# Patient Record
Sex: Male | Born: 1960 | Race: White | Hispanic: No | Marital: Married | Smoking: Former smoker
Health system: Southern US, Community
[De-identification: ages and names within clinical notes are randomized; demographics above are authoritative.]

## PROBLEM LIST (undated history)

## (undated) ENCOUNTER — Emergency Department (HOSPITAL_BASED_OUTPATIENT_CLINIC_OR_DEPARTMENT_OTHER): Admission: EM | Payer: Managed Care, Other (non HMO) | Source: Home / Self Care

## (undated) DIAGNOSIS — K760 Fatty (change of) liver, not elsewhere classified: Secondary | ICD-10-CM

## (undated) DIAGNOSIS — N400 Enlarged prostate without lower urinary tract symptoms: Secondary | ICD-10-CM

## (undated) DIAGNOSIS — M069 Rheumatoid arthritis, unspecified: Principal | ICD-10-CM

## (undated) HISTORY — DX: Benign prostatic hyperplasia without lower urinary tract symptoms: N40.0

## (undated) HISTORY — PX: OTHER SURGICAL HISTORY: SHX169

## (undated) HISTORY — DX: Rheumatoid arthritis, unspecified: M06.9

## (undated) HISTORY — PX: BACK SURGERY: SHX140

## (undated) HISTORY — DX: Fatty (change of) liver, not elsewhere classified: K76.0

## (undated) HISTORY — PX: COLONOSCOPY: SHX174

---

## 2005-12-15 HISTORY — PX: NOSE SURGERY: SHX723

## 2005-12-15 HISTORY — PX: OTHER SURGICAL HISTORY: SHX169

## 2011-11-13 LAB — HEPATIC FUNCTION PANEL
ALK PHOS: 53 U/L (ref 25–125)
ALT: 83 U/L — AB (ref 10–40)
AST: 28 U/L (ref 14–40)

## 2013-10-05 ENCOUNTER — Encounter: Payer: Self-pay | Admitting: Family Medicine

## 2013-10-05 ENCOUNTER — Ambulatory Visit (INDEPENDENT_AMBULATORY_CARE_PROVIDER_SITE_OTHER): Payer: Managed Care, Other (non HMO) | Admitting: Family Medicine

## 2013-10-05 VITALS — BP 127/80 | HR 75 | Ht 73.0 in | Wt 285.0 lb

## 2013-10-05 DIAGNOSIS — R229 Localized swelling, mass and lump, unspecified: Secondary | ICD-10-CM | POA: Insufficient documentation

## 2013-10-05 DIAGNOSIS — N400 Enlarged prostate without lower urinary tract symptoms: Secondary | ICD-10-CM | POA: Insufficient documentation

## 2013-10-05 DIAGNOSIS — M069 Rheumatoid arthritis, unspecified: Secondary | ICD-10-CM

## 2013-10-05 DIAGNOSIS — M549 Dorsalgia, unspecified: Secondary | ICD-10-CM | POA: Insufficient documentation

## 2013-10-05 HISTORY — DX: Rheumatoid arthritis, unspecified: M06.9

## 2013-10-05 MED ORDER — MELOXICAM 15 MG PO TABS: 15.0000 mg | ORAL_TABLET | Freq: Every day | ORAL | Status: DC

## 2013-10-05 NOTE — Progress Notes (Deleted)
  Subjective:    Patient ID: Walter Douglas, male    DOB: 08-Mar-1961, 52 y.o.   MRN: [id number]  HPI    Review of Systems     Objective:   Physical Exam        Assessment & Plan:

## 2013-10-05 NOTE — Progress Notes (Signed)
CC: Walter Douglas is a 52 y.o. male is here for Establish Care and Back Pain   Subjective: HPI:  Very pleasant 52 year old with a self-reported diagnosis of rheumatoid arthritis here to establish care  Patient complains of right low back pain that has been present since Saturday of this last week without any inciting event. It is described as a soreness it is nonradiating mild to moderate severity. Present mostly when going from a seated to standing position greatly improves the more he walks. Pain is worse with pressing on the low back. It does not radiate. He has had similar pain in the remote past but never to this degree. Has tried 1 or 2 doses of an anti-inflammatory without much improvement. He denies midline back pain, saddle paresthesia, nor bowel or bladder incontinence. Symptoms have been present on a daily basis it's not uncommon for pain to go completely away if he is mobile. Denies weakness in either leg.  He admits to over 30 pounds of weight loss in the past months however this is due to drastic increase in physical activity and dietary changes  Review of Systems - General ROS: negative for - chills, fever, night sweats, or weight loss Ophthalmic ROS: negative for - decreased vision Psychological ROS: negative for - anxiety or depression ENT ROS: negative for - hearing change, nasal congestion, tinnitus or allergies Hematological and Lymphatic ROS: negative for - bleeding problems, bruising or swollen lymph nodes Breast ROS: negative Respiratory ROS: no cough, shortness of breath, or wheezing Cardiovascular ROS: no chest pain or dyspnea on exertion Gastrointestinal ROS: no abdominal pain, change in bowel habits, or black or bloody stools Genito-Urinary ROS: negative for - genital discharge, genital ulcers, incontinence or abnormal bleeding from genitals Musculoskeletal ROS: negative for - joint pain or muscle pain other than that above Neurological ROS: negative for - headaches  or memory loss Dermatological ROS: negative for lumps, mole changes, rash and skin lesion changes  Past Medical History  Diagnosis Date  . Rheumatoid arthritis 10/05/2013    Pt self reported      History reviewed. No pertinent family history.   History  Substance Use Topics  . Smoking status: Former Games developer  . Smokeless tobacco: Not on file     Comment: quit 20 years ago  . Alcohol Use: No     Objective: Filed Vitals:   10/05/13 1522  BP: 127/80  Pulse: 75    General: Alert and Oriented, No Acute Distress HEENT: Pupils equal, round, reactive to light. Conjunctivae clear.  Moist mucous membranes pharynx unremarkable Lungs: Clear to auscultation bilaterally, no wheezing/ronchi/rales.  Comfortable work of breathing. Good air movement. Cardiac: Regular rate and rhythm. Normal S1/S2.  No murmurs, rubs, nor gallops.   Abdomen: Obese soft nontender Extremities: No peripheral edema.  Strong peripheral pulses. Full range of motion strength in both lower extremities L4 and S1 DTRs two over four bilaterally. Bilaterally straight leg raises negative, negative log roll, negative FABER/FADIR Back: No midline spinous process tenderness in the lumbar region, stork test negative bilaterally, pain is reproduced with palpation of a approximately 2-3 cm nodule just above the right SI joint which is mobile Mental Status: No depression, anxiety, nor agitation. Skin: Warm and dry.  Assessment & Plan: Walter Douglas was seen today for establish care and back pain.  Diagnoses and associated orders for this visit:  Rheumatoid arthritis  Back pain - meloxicam (MOBIC) 15 MG tablet; Take 1 tablet (15 mg total) by mouth daily.  Subcutaneous nodule  Discussed with patient my suspicion of inflamed lipoma or sebaceous cyst causing his pain, I encouraged him to take a week of daily meloxicam if not improving in one week I like to have a CT scan with contrast to screen for any oncologic process Rheumatoid  arthritis: Awaiting outside records for clarification of this, suspicious of this diagnosis given that he is never been on arthritis medication beyond diclofenac  Return if symptoms worsen or fail to improve.

## 2013-10-20 ENCOUNTER — Other Ambulatory Visit: Payer: Self-pay

## 2013-12-19 ENCOUNTER — Encounter: Payer: Self-pay | Admitting: Family Medicine

## 2013-12-20 ENCOUNTER — Encounter: Payer: Self-pay | Admitting: Family Medicine

## 2013-12-20 ENCOUNTER — Ambulatory Visit (INDEPENDENT_AMBULATORY_CARE_PROVIDER_SITE_OTHER): Payer: Managed Care, Other (non HMO) | Admitting: Family Medicine

## 2013-12-20 VITALS — BP 133/87 | HR 87 | Wt 306.0 lb

## 2013-12-20 DIAGNOSIS — R454 Irritability and anger: Secondary | ICD-10-CM

## 2013-12-20 DIAGNOSIS — R222 Localized swelling, mass and lump, trunk: Secondary | ICD-10-CM | POA: Insufficient documentation

## 2013-12-20 DIAGNOSIS — M549 Dorsalgia, unspecified: Secondary | ICD-10-CM

## 2013-12-20 DIAGNOSIS — R229 Localized swelling, mass and lump, unspecified: Secondary | ICD-10-CM

## 2013-12-20 MED ORDER — CYCLOBENZAPRINE HCL 10 MG PO TABS: ORAL_TABLET | ORAL | Status: DC

## 2013-12-20 MED ORDER — ESCITALOPRAM OXALATE 10 MG PO TABS: 10.0000 mg | ORAL_TABLET | Freq: Every day | ORAL | Status: DC

## 2013-12-20 NOTE — Progress Notes (Signed)
CC: Walter Douglas is a 53 y.o. male is here for depression?   Subjective: HPI:   followup back pain: Over the past 3 months patient reports that he has had waxing and waning low back pain similar to that which she presented for initially. Taking meloxicam most days of the week mild improvement. Pain is currently mild to moderate severity. Nothing particularly makes it better or worse. It is described as low back pain right greater than left radiation around the lateral trunks with physical exertion. No particular movement makes better or worse. Pain is reproduced with palpation of right low back where there is a palpable nodule. He believes that the nodule may be shrinking since he saw me last. Denies unintentional weight loss nor new muscle or skeletal pain. Denies midline back pain, saddle paresthesia, bowel or bladder incontinence nor radiation of pain into the legs.  Requesting refill on Lexapro. He restarted his medication when he moved down here from [place]. It has been helping with irritability and short fuse. Quality of life is currently satisfactory subjectively while on this medication. Denies other when harm himself or others. It has made a big improvement with tolerability of coworkers. Denies anxiety or subjective depression   Review Of Systems Outlined In HPI  Past Medical History  Diagnosis Date  . Rheumatoid arthritis 10/05/2013    Pt self reported      History reviewed. No pertinent family history.   History  Substance Use Topics  . Smoking status: Former Research scientist (life sciences)  . Smokeless tobacco: Not on file     Comment: quit 20 years ago  . Alcohol Use: No     Objective: Filed Vitals:   12/20/13 1534  BP: 133/87  Pulse: 87    General: Alert and Oriented, No Acute Distress  HEENT: Pupils equal, round, reactive to light. Conjunctivae clear. Moist mucous membranes pharynx unremarkable  Lungs: Clear to auscultation bilaterally, no wheezing/ronchi/rales. Comfortable work of  breathing. Good air movement.  Cardiac: Regular rate and rhythm. Normal S1/S2. No murmurs, rubs, nor gallops.  Abdomen: Obese soft nontender  Extremities: No peripheral edema. Strong peripheral pulses. Full range of motion strength in both lower extremities L4 and S1 DTRs two over four bilaterally. Bilaterally straight leg raises negative, negative log roll, negative FABER/FADIR  Back: No midline spinous process tenderness in the lumbar region, stork test negative bilaterally, pain is reproduced with palpation of a approximately 2.5 cm nodule just above the right SI joint which is mobile  Mental Status: No depression, anxiety, nor agitation.  Skin: Warm and dry.   Assessment & Plan: Walter Douglas was seen today for depression?.  Diagnoses and associated orders for this visit:  Back pain - cyclobenzaprine (FLEXERIL) 10 MG tablet; Take a half to a full tab every 8-12 hours only as needed for muscle spasm or back pain, may cause sedation.  Mass on back - cyclobenzaprine (FLEXERIL) 10 MG tablet; Take a half to a full tab every 8-12 hours only as needed for muscle spasm or back pain, may cause sedation.  Irritability - escitalopram (LEXAPRO) 10 MG tablet; Take 1 tablet (10 mg total) by mouth daily.    Irritability: Controlled continue Lexapro Back pain with subcutaneous mass: Continue Mobic and start cyclobenzaprine if this does not relieve any degree of his back pain joint decision to obtain CT scan of abdomen pelvis given his history of TURP x 6, fortunately he tells me that PSAs have been normal in the past we are re-requesting outside records  25  minutes spent face-to-face during visit today of which at least 50% was counseling or coordinating care regarding: 1. Back pain   2. Mass on back   3. Irritability      Return if symptoms worsen or fail to improve.

## 2013-12-22 ENCOUNTER — Telehealth: Payer: Self-pay | Admitting: Family Medicine

## 2013-12-22 ENCOUNTER — Telehealth: Payer: Self-pay | Admitting: *Deleted

## 2013-12-22 DIAGNOSIS — M549 Dorsalgia, unspecified: Secondary | ICD-10-CM

## 2013-12-22 DIAGNOSIS — R222 Localized swelling, mass and lump, trunk: Secondary | ICD-10-CM

## 2013-12-22 NOTE — Telephone Encounter (Signed)
Pt no better per email

## 2013-12-22 NOTE — Telephone Encounter (Signed)
No PA required for CT ABD/Pelvis w/contrast.  Adaja Wander, LPN  

## 2013-12-23 ENCOUNTER — Ambulatory Visit (HOSPITAL_BASED_OUTPATIENT_CLINIC_OR_DEPARTMENT_OTHER)
Admission: RE | Admit: 2013-12-23 | Discharge: 2013-12-23 | Disposition: A | Discharge status: Home / Self Care | Payer: Managed Care, Other (non HMO) | Source: Ambulatory Visit | Attending: Family Medicine | Admitting: Family Medicine

## 2013-12-23 DIAGNOSIS — M545 Low back pain, unspecified: Secondary | ICD-10-CM | POA: Insufficient documentation

## 2013-12-23 DIAGNOSIS — R222 Localized swelling, mass and lump, trunk: Secondary | ICD-10-CM

## 2013-12-23 DIAGNOSIS — K429 Umbilical hernia without obstruction or gangrene: Secondary | ICD-10-CM | POA: Insufficient documentation

## 2013-12-23 DIAGNOSIS — M51379 Other intervertebral disc degeneration, lumbosacral region without mention of lumbar back pain or lower extremity pain: Secondary | ICD-10-CM | POA: Insufficient documentation

## 2013-12-23 DIAGNOSIS — N62 Hypertrophy of breast: Secondary | ICD-10-CM | POA: Insufficient documentation

## 2013-12-23 DIAGNOSIS — M549 Dorsalgia, unspecified: Secondary | ICD-10-CM

## 2013-12-23 DIAGNOSIS — R229 Localized swelling, mass and lump, unspecified: Secondary | ICD-10-CM | POA: Insufficient documentation

## 2013-12-23 DIAGNOSIS — K5730 Diverticulosis of large intestine without perforation or abscess without bleeding: Secondary | ICD-10-CM | POA: Insufficient documentation

## 2013-12-23 DIAGNOSIS — M5137 Other intervertebral disc degeneration, lumbosacral region: Secondary | ICD-10-CM | POA: Insufficient documentation

## 2013-12-23 MED ORDER — IOHEXOL 300 MG/ML  SOLN
100.0000 mL | Freq: Once | INTRAMUSCULAR | Status: AC | PRN
  Administered 2013-12-23: 100 mL via INTRAVENOUS

## 2013-12-26 ENCOUNTER — Telehealth: Payer: Self-pay | Admitting: Family Medicine

## 2013-12-26 ENCOUNTER — Encounter: Payer: Self-pay | Admitting: Family Medicine

## 2013-12-26 DIAGNOSIS — K639 Disease of intestine, unspecified: Secondary | ICD-10-CM | POA: Insufficient documentation

## 2013-12-26 NOTE — Telephone Encounter (Signed)
Seth Bake, Will you please let Mr. Chesnut know that his CT scan did not show any findings that should concern [place] about the mass in his back being cancer.  It's probably a benign cyst that we've been discussing.  If he's interested in having it removed I'd be happy to refer him to a Education officer, environmental.   The radiologist noted that his large bowel is thicker than the average individual and has recommended that he see a gastroenterologist to determine if there's a need for any further workup.  Because of this I've placed a GI referral.

## 2013-12-26 NOTE — Telephone Encounter (Signed)
Left message for pt to call backl

## 2013-12-27 ENCOUNTER — Telehealth: Payer: Self-pay | Admitting: Family Medicine

## 2013-12-27 ENCOUNTER — Encounter: Payer: Self-pay | Admitting: Gastroenterology

## 2013-12-27 DIAGNOSIS — R222 Localized swelling, mass and lump, trunk: Secondary | ICD-10-CM

## 2013-12-27 NOTE — Telephone Encounter (Signed)
Awaiting referral preference from patient

## 2014-01-05 ENCOUNTER — Encounter: Payer: Self-pay | Admitting: Family Medicine

## 2014-01-05 ENCOUNTER — Ambulatory Visit (INDEPENDENT_AMBULATORY_CARE_PROVIDER_SITE_OTHER): Payer: Managed Care, Other (non HMO) | Admitting: General Surgery

## 2014-01-05 ENCOUNTER — Encounter: Payer: Self-pay | Admitting: Gastroenterology

## 2014-01-05 ENCOUNTER — Encounter (INDEPENDENT_AMBULATORY_CARE_PROVIDER_SITE_OTHER): Payer: Self-pay | Admitting: General Surgery

## 2014-01-05 VITALS — BP 130/90 | HR 72 | Temp 97.8°F | Resp 15 | Ht 73.0 in | Wt 300.8 lb

## 2014-01-05 DIAGNOSIS — K760 Fatty (change of) liver, not elsewhere classified: Secondary | ICD-10-CM | POA: Insufficient documentation

## 2014-01-05 DIAGNOSIS — R229 Localized swelling, mass and lump, unspecified: Secondary | ICD-10-CM

## 2014-01-05 MED ORDER — TAMSULOSIN HCL 0.4 MG PO CAPS: 0.4000 mg | ORAL_CAPSULE | Freq: Every day | ORAL | Status: DC

## 2014-01-05 NOTE — Patient Instructions (Signed)

## 2014-01-05 NOTE — Progress Notes (Signed)
Patient ID: Walter Douglas, male   DOB: August 02, 1961, 53 y.o.   MRN: [id number]  Chief Complaint  Patient presents with  . New Evaluation    eval mass on back    HPI Walter Douglas is a 53 y.o. male.   HPI 89 [facility] referred by Dr Ileene Rubens For evaluation of a lower back subcutaneous nodule. The patient states that he first noticed the area in October. At that time the area was larger however it didn't cause a lot of discomfort. Now he is having a fair amount of discomfort in that area. He describes it as a sharp stabbing pain. He does have discomfort that radiates down his leg. He describes it as a dull discomfort is going down his leg. He states that walking increases the discomfort. He tried Meloxicam without any improvement. He denies any fevers or chills. He denies any trauma to area. He states that to his knowledge it has never been red, swollen or drained any fluid. He denies any changes to sensation in his lower extremities. He does exercise 3 times a week with his wife in an exercise class. Past Medical History  Diagnosis Date  . Rheumatoid arthritis 10/05/2013    Pt self reported     Past Surgical History  Procedure Laterality Date  . Rotator cuff surgery  2007  . Nose surgery  2007    Family History  Problem Relation Age of Onset  . Cancer Father     bone  . Cancer Sister     breast  . Cancer Sister     ovarian    Social History History  Substance Use Topics  . Smoking status: Former Smoker    Quit date: 01/05/1994  . Smokeless tobacco: Never Used     Comment: quit 20 years ago  . Alcohol Use: No    Allergies  Allergen Reactions  . Ciprofloxacin     Tendinopathy    Current Outpatient Prescriptions  Medication Sig Dispense Refill  . escitalopram (LEXAPRO) 10 MG tablet Take 1 tablet (10 mg total) by mouth daily.  90 tablet  1   No current facility-administered medications for this visit.    Review of Systems Review of Systems  Constitutional: Negative for fever,  chills, appetite change and unexpected weight change.  HENT: Negative for congestion and trouble swallowing.   Eyes: Negative for visual disturbance.  Respiratory: Negative for chest tightness and shortness of breath.   Cardiovascular: Positive for leg swelling. Negative for chest pain.       No PND, no orthopnea, no DOE  Gastrointestinal:       See HPI  Genitourinary: Negative for dysuria and hematuria.       Has had 5 TURPs; history of hypospadia  Musculoskeletal: Negative.        OA pains  Skin: Negative for rash.  Neurological: Negative for seizures and speech difficulty.       Denies TIAs and amaurosis fugax  Hematological: Does not bruise/bleed easily.  Psychiatric/Behavioral: Negative for behavioral problems and confusion.    Blood pressure 130/90, pulse 72, temperature 97.8 F (36.6 C), temperature source Temporal, resp. rate 15, height 6\' 1"  (1.854 m), weight 300 lb 12.8 oz (136.442 kg).  Physical Exam Physical Exam  Vitals reviewed. Constitutional: He is oriented to person, place, and time. He appears well-developed and well-nourished. No distress.  obese  HENT:  Head: Normocephalic and atraumatic.  Right Ear: External ear normal.  Left Ear: External ear normal.  Eyes: Conjunctivae are  normal. No scleral icterus.  Neck: Normal range of motion. Neck supple. No tracheal deviation present. No thyromegaly present.  Cardiovascular: Normal rate, normal heart sounds and intact distal pulses.   Pulmonary/Chest: Effort normal and breath sounds normal. No respiratory distress. He has no wheezes.  Abdominal: Soft. He exhibits no distension. There is no tenderness.  Musculoskeletal: Normal range of motion. He exhibits no edema and no tenderness.  Strength is symmetrical in bilateral lower extremities 5 out of 5  Lymphadenopathy:    He has no cervical adenopathy.  Neurological: He is alert and oriented to person, place, and time. He exhibits normal muscle tone.  Gross sensation  intact  Skin: Skin is warm and dry. No rash noted. He is not diaphoretic. No erythema. No pallor.     Lower back just to the right of the spine there is a deep subcutaneous mass that is partially well-circumscribed approximately 3-3-1/2 cm vertically by about 1-1/2-2 cm wide. No overlying skin changes. Mobile.  Psychiatric: He has a normal mood and affect. His behavior is normal. Judgment and thought content normal.    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 It could also be a sebaceous cyst however I believe is more consistent with lipoma. We reviewed his CT findings which also demonstrated degenerative disease of the lumbar spine particularly L5-S1. His discomfort appears to be located in this area. We discussed that excision of the lipoma has a good chance of not ameliorating all of his discomfort. I explained that he may need to be evaluated by an orthopedic surgeon.  We discussed the etiology and management of lipomas. The patient was given educational material. We discussed that the majority of lipomas are benign although on a rare occasion it can be malignant.   We discussed observation versus surgical excision. We discussed the risks and benefits of surgery including but not limited to bleeding, infection, injury to surrounding structures, scarring, cosmetic concerns, possible temporary drain placement, blood clot formation, anesthesia issues, possible recurrence, and the typical postoperative course.   The patient has elected to Proceed with excision of his lower back subcutaneous mass. He understands that this may not ameliorate his discomfort but he would like to have the area removed in order to see if it would get any improvement. He will meet with our surgery scheduler  Today.  Because of his significant issues with his prostate in the past. I  will place him on perioperative Flomax to decrease his risk of postoperative urinary  Leighton Ruff. Redmond Pulling, MD, FACS General, Bariatric, & Minimally Invasive Surgery [facility], [place]          [facility] M 01/05/2014, 10:01 AM

## 2014-01-06 ENCOUNTER — Encounter: Payer: Self-pay | Admitting: *Deleted

## 2014-01-09 ENCOUNTER — Telehealth: Payer: Self-pay | Admitting: Family Medicine

## 2014-01-09 ENCOUNTER — Encounter: Payer: Self-pay | Admitting: Family Medicine

## 2014-01-09 MED ORDER — AMBULATORY NON FORMULARY MEDICATION: Status: DC

## 2014-01-09 NOTE — Telephone Encounter (Signed)
Kool's Solution Equal parts: Erythromycin 200mg /21ml, Elixir of Benadryl, Dexamethasone 0.5 mg/56ml as written Orally Disp:**171mL** (ONE HUNDRED FIFTY) Sig: 67mL Hold in mouth and gargle, spit BID 14 days

## 2014-01-16 ENCOUNTER — Ambulatory Visit: Payer: Managed Care, Other (non HMO) | Admitting: Gastroenterology

## 2014-01-19 ENCOUNTER — Encounter: Payer: Self-pay | Admitting: Family Medicine

## 2014-01-24 ENCOUNTER — Other Ambulatory Visit (INDEPENDENT_AMBULATORY_CARE_PROVIDER_SITE_OTHER): Payer: Self-pay | Admitting: General Surgery

## 2014-01-24 ENCOUNTER — Other Ambulatory Visit (INDEPENDENT_AMBULATORY_CARE_PROVIDER_SITE_OTHER): Payer: Self-pay | Admitting: *Deleted

## 2014-01-24 DIAGNOSIS — D1739 Benign lipomatous neoplasm of skin and subcutaneous tissue of other sites: Secondary | ICD-10-CM

## 2014-01-24 MED ORDER — OXYCODONE-ACETAMINOPHEN 5-325 MG PO TABS: 1.0000 | ORAL_TABLET | ORAL | Status: DC | PRN

## 2014-01-25 ENCOUNTER — Encounter: Payer: Self-pay | Admitting: Family Medicine

## 2014-02-01 ENCOUNTER — Ambulatory Visit: Payer: Managed Care, Other (non HMO) | Admitting: Gastroenterology

## 2014-02-10 ENCOUNTER — Telehealth: Payer: Self-pay | Admitting: Gastroenterology

## 2014-02-10 NOTE — Telephone Encounter (Signed)
Patient recently had a CT scan performed for a back problem that showed a thickening of the bowel wall.  He was referred here by Dr. Sharion Balloon.  He would like you to look at his colonoscopy report from 2013 in [place] and the CT and let him know if you feel an appt is needed.  He is having absolutely no problems.  Specifically denies abdominal pain, nausea, vomiting, blood in stool, fevers, constipation or diarrhea.  I will place the CT and colon from 2013 on your desk.  Please review and advise if patient should come for office visit and or colonoscopy.

## 2014-02-13 NOTE — Telephone Encounter (Signed)
Patient advised that if he wishes he can postpone the consult, but can't tell if there is problem until he is properly evaluated.Marland Kitchen  He can return to Dr. Barbaraann Barthel and have him repeat the CT scan in 2-3 months.  He will think about what he wants to do then he will call back if he would like to proceed with consult here.

## 2014-02-13 NOTE — Telephone Encounter (Signed)
Underdistension of bowel might be the cause but it could be a real finding. We can repeat his CT scan in a 2-3 months to see if the abnormalities persist. If they do we need to investigate further.

## 2014-02-15 ENCOUNTER — Encounter (INDEPENDENT_AMBULATORY_CARE_PROVIDER_SITE_OTHER): Payer: Managed Care, Other (non HMO) | Admitting: General Surgery

## 2014-03-10 ENCOUNTER — Ambulatory Visit (INDEPENDENT_AMBULATORY_CARE_PROVIDER_SITE_OTHER): Payer: Managed Care, Other (non HMO) | Admitting: General Surgery

## 2014-03-10 ENCOUNTER — Encounter (INDEPENDENT_AMBULATORY_CARE_PROVIDER_SITE_OTHER): Payer: Self-pay | Admitting: General Surgery

## 2014-03-10 VITALS — BP 118/86 | HR 72 | Temp 97.7°F | Resp 16 | Ht 73.0 in | Wt 304.8 lb

## 2014-03-10 DIAGNOSIS — Z09 Encounter for follow-up examination after completed treatment for conditions other than malignant neoplasm: Secondary | ICD-10-CM

## 2014-03-10 NOTE — Progress Notes (Signed)
Subjective:     Patient ID: Walter Douglas, male   DOB: August 12, 1961, 53 y.o.   MRN: [id number]  HPI 53 year old Caucasian male comes in for followup after undergoing excision of  right lower back lipoma on February 10. He states he is doing well until about a week and a half ago when he noticed a recurrent lump under the incision. He had been resuming an aggressive cardiac fitness exercise class. He denies any fevers or chills. He denies any pain in the area. He denies any drainage.  Review of Systems     Objective:   Physical Exam BP 118/86  Pulse 72  Temp(Src) 97.7 F (36.5 C) (Temporal)  Resp 16  Ht 6\' 1"  (1.854 m)  Wt 304 lb 12.8 oz (138.256 kg)  BMI 40.22 kg/m2 Alert, nontoxic Back-vertical right lower back Paraspinal incision completely healed. At apex of incision there is a palpable subcutaneous mass of about 3 cm x 2 cm. Soft,. Nontender. No cellulitis no fluctuance    Assessment:     Status post excision of right lower back lipoma Postoperative hematoma     Plan:     We reviewed his pathology report and he was provided a copy which showed a lipoma. The lump at the apex of the incision is most consistent with a hematoma. It does not feel like a seroma. I advised him it will take time for that to go away. It may take up to 3 months. He was instructed to call if the area was persistent otherwise followup as needed  Leighton Ruff. Redmond Pulling, MD, FACS General, Bariatric, & Minimally Invasive Surgery [facility], [place]

## 2014-03-10 NOTE — Patient Instructions (Signed)
This is probably a hematoma. It may take 2-3 months for the swelling to go down. Please call if it is persistent

## 2014-04-03 ENCOUNTER — Ambulatory Visit (INDEPENDENT_AMBULATORY_CARE_PROVIDER_SITE_OTHER): Payer: Managed Care, Other (non HMO) | Admitting: Gastroenterology

## 2014-04-03 ENCOUNTER — Encounter: Payer: Self-pay | Admitting: Gastroenterology

## 2014-04-03 VITALS — BP 102/80 | HR 80 | Ht 73.0 in | Wt 304.4 lb

## 2014-04-03 DIAGNOSIS — R933 Abnormal findings on diagnostic imaging of other parts of digestive tract: Secondary | ICD-10-CM

## 2014-04-03 DIAGNOSIS — R1032 Left lower quadrant pain: Secondary | ICD-10-CM

## 2014-04-03 DIAGNOSIS — R1031 Right lower quadrant pain: Secondary | ICD-10-CM

## 2014-04-03 DIAGNOSIS — R109 Unspecified abdominal pain: Secondary | ICD-10-CM

## 2014-04-03 MED ORDER — PEG-KCL-NACL-NASULF-NA ASC-C 100 G PO SOLR: 1.0000 | Freq: Once | ORAL | Status: DC

## 2014-04-03 MED ORDER — HYOSCYAMINE SULFATE 0.125 MG SL SUBL: SUBLINGUAL_TABLET | SUBLINGUAL | Status: AC

## 2014-04-03 NOTE — Progress Notes (Signed)
    History of Present Illness: This is a 53 year old male accompanied by his wife. He underwent a CT scan in January 2015 showing bowel wall thickening from the ascending colon to the hepatic flexure. In addition a short segment of jejunum was thickened. A colonoscopy report from November 2013 performed in [place] showed diverticulosis. He has occasional reflux symptoms. Since  traveling to [place] for Easter he has had intermittent  crampy lower abdominal pain and looser stools. He notes looser stools when he takes Arthrotec but generally only uses it once a week. He takes Pepcid occasionally for his heartburn symptoms which appears to be effective. Denies weight loss, constipation, change in stool caliber, melena, hematochezia, nausea, vomiting, dysphagia, chest pain.  Review of Systems: Pertinent positive and negative review of systems were noted in the above HPI section. All other review of systems were otherwise negative.  Current Medications, Allergies, Past Medical History, Past Surgical History, Family History and Social History were reviewed in [employer] record.  Physical Exam: General: Well developed , well nourished, no acute distress Head: Normocephalic and atraumatic Eyes:  sclerae anicteric, EOMI Ears: Normal auditory acuity Mouth: No deformity or lesions Neck: Supple, no masses or thyromegaly Lungs: Clear throughout to auscultation Heart: Regular rate and rhythm; no murmurs, rubs or bruits Abdomen: Soft, non tender and non distended. No masses, hepatosplenomegaly or hernias noted. Normal Bowel sounds Rectal: Deferred to colonoscopy  Musculoskeletal: Symmetrical with no gross deformities  Skin: No lesions on visible extremities Pulses:  Normal pulses noted Extremities: No clubbing, cyanosis, edema or deformities noted Neurological: Alert oriented x 4, grossly nonfocal Cervical Nodes:  No significant cervical adenopathy Inguinal Nodes: No significant  inguinal adenopathy Psychological:  Alert and cooperative. Normal mood and affect  Assessment and Recommendations:  1. Abnormal thickening of the ascending colon to the hepatic flexure. Etiology unclear. This may represent under distention of the colon or a colon disorder. I'm unsure as to the cause of the jejunal thickening. I offered him to undergo repeat CT scan with CT enterography to further evaluate the small bowel. He would like to avoid another CT scan if possible. Schedule colonoscopy. The risks, benefits, and alternatives to colonoscopy with possible biopsy and possible polypectomy were discussed with the patient and they consent to proceed.   2. Short history of looser stools and crampy lower abdominal pain. I suspect this is a self-limited process. Trial of hyoscyamine as needed.  3. GERD. Standard antireflux measures and Pepcid twice a day when necessary.

## 2014-04-03 NOTE — Patient Instructions (Signed)
We have sent the following medications to your pharmacy for you to pick up at your convenience: Levsin.  You have been scheduled for a colonoscopy with propofol. Please follow written instructions given to you at your visit today.  Please pick up your prep kit at the pharmacy within the next 1-3 days. If you use inhalers (even only as needed), please bring them with you on the day of your procedure. Your physician has requested that you go to www.startemmi.com and enter the access code given to you at your visit today. This web site gives a general overview about your procedure. However, you should still follow specific instructions given to you by our office regarding your preparation for the procedure.  Thank you for choosing me and [facility].  Pricilla Riffle. Dagoberto Ligas., MD., Marval Regal

## 2014-04-11 ENCOUNTER — Encounter: Payer: Self-pay | Admitting: Gastroenterology

## 2014-04-21 ENCOUNTER — Ambulatory Visit (AMBULATORY_SURGERY_CENTER): Payer: Managed Care, Other (non HMO) | Admitting: Gastroenterology

## 2014-04-21 ENCOUNTER — Ambulatory Visit: Payer: Self-pay | Admitting: Family Medicine

## 2014-04-21 ENCOUNTER — Encounter: Payer: Self-pay | Admitting: Gastroenterology

## 2014-04-21 VITALS — BP 123/64 | HR 73 | Temp 97.9°F | Resp 20 | Ht 73.0 in | Wt 304.0 lb

## 2014-04-21 DIAGNOSIS — R933 Abnormal findings on diagnostic imaging of other parts of digestive tract: Secondary | ICD-10-CM

## 2014-04-21 MED ORDER — SODIUM CHLORIDE 0.9 % IV SOLN: 500.0000 mL | INTRAVENOUS | Status: DC

## 2014-04-21 NOTE — Op Note (Signed)
[facility]  [address] [place], [id number]   COLONOSCOPY PROCEDURE REPORT  PATIENT: Walter Douglas, Walter Douglas  MR#: [id number] BIRTHDATE: [birth year] , 52  yrs. old GENDER: Male ENDOSCOPIST: Ladene Artist, MD, [facility] REFERRED BY: Marcial Pacas, M.D. PROCEDURE DATE:  04/21/2014 PROCEDURE:   Colonoscopy, diagnostic First Screening Colonoscopy - Avg.  risk and is 50 yrs.  old or older - No.  Prior Negative Screening - Now for repeat screening. N/A  History of Adenoma - Now for follow-up colonoscopy & has been > or = to 3 yrs.  N/A  Polyps Removed Today? No.  Recommend repeat exam, <10 yrs? No. ASA CLASS:   Class II INDICATIONS:an abnormal CT. MEDICATIONS: MAC sedation, administered by CRNA and propofol (Diprivan) 400mg  IV DESCRIPTION OF PROCEDURE:   After the risks benefits and alternatives of the procedure were thoroughly explained, informed consent was obtained.  A digital rectal exam revealed no abnormalities of the rectum.   The LB [id number]  endoscope was introduced through the anus and advanced to the cecum, which was identified by both the appendix and ileocecal valve. No adverse events experienced.   The quality of the prep was excellent, using MoviPrep  The instrument was then slowly withdrawn as the colon was fully examined.  COLON FINDINGS: Mild diverticulosis was noted in the descending colon. The colon was otherwise normal. There was no diverticulosis, inflammation, polyps or cancers unless previously stated. Retroflexed views revealed no abnormalities. The time to cecum=6 minutes 09 seconds.  Withdrawal time=9 minutes 34 seconds.  The scope was withdrawn and the procedure completed. COMPLICATIONS: There were no complications.  ENDOSCOPIC IMPRESSION: 1.   Mild diverticulosis in the descending colon 2.   The colon was otherwise normal  RECOMMENDATIONS: 1.  High fiber diet with liberal fluid intake. 2.  You should continue to follow colorectal cancer  screening guidelines for "routine risk" patients with a repeat colonoscopy in 10 years.  There is no need for routine, screening FOBT (stool) testing for at least 5 years.  eSigned:  Ladene Artist, MD, [facility] 04/21/2014 3:55 PM

## 2014-04-21 NOTE — Patient Instructions (Signed)

## 2014-04-21 NOTE — Progress Notes (Signed)
  [facility] Post-op Note  Patient: Walter Douglas  Procedure(s) Performed: colonoscopy  Patient Location: [facility] - Recovery Area  Anesthesia Type: Deep Sedation/Propofol  Level of Consciousness: awake, oriented and patient cooperative  Airway and Oxygen Therapy: Patient Spontanous Breathing  Post-op Pain: none  Post-op Assessment:  Post-op Vital signs reviewed, Patient's Cardiovascular Status Stable, Respiratory Function Stable, Patent Airway, No signs of Nausea or vomiting and Pain level controlled  Post-op Vital Signs: Reviewed and stable  Complications: No apparent anesthesia complications  Jessicca Stitzer E Shamica Moree 4:02 PM

## 2014-04-24 ENCOUNTER — Encounter: Payer: Self-pay | Admitting: Family Medicine

## 2014-04-24 ENCOUNTER — Telehealth: Payer: Self-pay | Admitting: *Deleted

## 2014-04-24 NOTE — Telephone Encounter (Signed)
  Follow up Call-  Call back number 04/21/2014  Post procedure Call Back phone  # [phone]  Permission to leave phone message Yes     Patient questions:  Left message to call us if necessary.

## 2014-05-23 ENCOUNTER — Encounter: Payer: Self-pay | Admitting: Family Medicine

## 2014-05-23 MED ORDER — DICLOFENAC SODIUM 75 MG PO TBEC
75.0000 mg | DELAYED_RELEASE_TABLET | Freq: Two times a day (BID) | ORAL | Status: DC | PRN
Start: 2014-05-23 — End: 2014-06-09

## 2014-05-23 MED ORDER — MISOPROSTOL 200 MCG PO TABS: 200.0000 ug | ORAL_TABLET | Freq: Two times a day (BID) | ORAL | Status: AC

## 2014-06-05 ENCOUNTER — Other Ambulatory Visit: Payer: Self-pay | Admitting: *Deleted

## 2014-06-05 DIAGNOSIS — R454 Irritability and anger: Secondary | ICD-10-CM

## 2014-06-05 MED ORDER — ESCITALOPRAM OXALATE 10 MG PO TABS: 10.0000 mg | ORAL_TABLET | Freq: Every day | ORAL | Status: DC

## 2014-06-06 ENCOUNTER — Encounter: Payer: Self-pay | Admitting: Family Medicine

## 2014-06-09 ENCOUNTER — Encounter: Payer: Self-pay | Admitting: Family Medicine

## 2014-06-09 ENCOUNTER — Ambulatory Visit (INDEPENDENT_AMBULATORY_CARE_PROVIDER_SITE_OTHER): Payer: Managed Care, Other (non HMO) | Admitting: Family Medicine

## 2014-06-09 ENCOUNTER — Ambulatory Visit (INDEPENDENT_AMBULATORY_CARE_PROVIDER_SITE_OTHER): Payer: Managed Care, Other (non HMO)

## 2014-06-09 VITALS — BP 121/86 | HR 77 | Wt 316.0 lb

## 2014-06-09 DIAGNOSIS — M25569 Pain in unspecified knee: Secondary | ICD-10-CM

## 2014-06-09 DIAGNOSIS — R454 Irritability and anger: Secondary | ICD-10-CM

## 2014-06-09 DIAGNOSIS — M25562 Pain in left knee: Secondary | ICD-10-CM

## 2014-06-09 DIAGNOSIS — M7120 Synovial cyst of popliteal space [Baker], unspecified knee: Secondary | ICD-10-CM

## 2014-06-09 MED ORDER — MELOXICAM 15 MG PO TABS: 15.0000 mg | ORAL_TABLET | Freq: Every day | ORAL | Status: DC

## 2014-06-09 MED ORDER — ESCITALOPRAM OXALATE 10 MG PO TABS: 10.0000 mg | ORAL_TABLET | Freq: Every day | ORAL | Status: AC

## 2014-06-09 NOTE — Progress Notes (Signed)
CC: Walter Douglas is a 53 y.o. male is here for f/u lexapro and left knee pain   Subjective: HPI:  Complains of left knee pain that has been present for the past month on a almost daily basis. Mild with extension of the knee, moderate with flexion of the knee. Worse the longer he stands on it throughout the day. Improves with diclofenac but he would prefer to take this sparingly to avoid stomach complications. He denies redness or warmth but does endorse swelling on the back of the knee. He denies any recent or remote trauma. He denies any catching locking or giving way. Denies any overlying skin changes nor motor or sensory disturbances in the left lower extremity  Followup irritability: Continues to take Lexapro a daily basis, he has tried to stop taking this in the past and had immediate return of his irritability towards others that interfere with his quality of life. He denies any known side effects while taking the Lexapro. Denies anxiety, depression or mental disturbance.   Review Of Systems Outlined In HPI  Past Medical History  Diagnosis Date  . Rheumatoid arthritis 10/05/2013    Pt self reported   . BPH (benign prostatic hyperplasia)   . Fatty liver disease, nonalcoholic     Past Surgical History  Procedure Laterality Date  . Rotator cuff surgery  2007  . Nose surgery  2007  . Back surgery    . Colonoscopy    . Vericose veins     Family History  Problem Relation Age of Onset  . Cancer Father     bone  . Cancer Sister     breast  . Cancer Sister     ovarian    History   Social History  . Marital Status: Married    Spouse Name: N/A    Number of Children: 3  . Years of Education: N/A   Occupational History  . Programmer    Social History Main Topics  . Smoking status: Former Smoker    Quit date: 01/05/1994  . Smokeless tobacco: Never Used     Comment: quit 20 years ago  . Alcohol Use: No  . Drug Use: No  . Sexual Activity: Yes    Partners: Female    Other Topics Concern  . Not on file   Social History Narrative  . No narrative on file     Objective: BP 121/86  Pulse 77  Wt 316 lb (143.337 kg)  General: Alert and Oriented, No Acute Distress HEENT: Pupils equal, round, reactive to light. Conjunctivae clear.  Moist mucous membranes pharynx unremarkable Lungs: Clear to auscultation bilaterally, no wheezing/ronchi/rales.  Comfortable work of breathing. Good air movement. Cardiac: Regular rate and rhythm. Normal S1/S2.  No murmurs, rubs, nor gallops.   Extremities: No peripheral edema.  Strong peripheral pulses. Left knee exam shows full-strength and range of motion. There is no swelling, redness, nor warmth overlying the knee.  No patellar crepitus. No patellar apprehension. No pain with palpation of the inferior patellar pole.  No pain or laxity with valgus nor varus stress. Anterior drawer is negative. McMurray's negative. There is moderate swelling in the popliteal space outpatient and this reproduces his pain, there is no pulsatile quality to this mass.. No medial or lateral joint line tenderness to palpation. Mental Status: No depression, anxiety, nor agitation. Skin: Warm and dry.  Assessment & Plan: Shaunte was seen today for f/u lexapro and left knee pain.  Diagnoses and associated orders for this visit:  Left knee pain - DG Knee Complete 4 Views Left; Future - meloxicam (MOBIC) 15 MG tablet; Take 1 tablet (15 mg total) by mouth daily. As needed for knee pain.  Irritability - escitalopram (LEXAPRO) 10 MG tablet; Take 1 tablet (10 mg total) by mouth daily.    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 Start Mobic instead of diclofenac. If no improvement after a few days I encouraged him to followup with Dr. Darene Lamer. in sports medicine for consideration of drainage of his Baker's cyst Irritability: Controlled continue Lexapro   Return if symptoms worsen or  fail to improve.

## 2014-06-12 ENCOUNTER — Encounter: Payer: Self-pay | Admitting: Family Medicine

## 2014-06-14 ENCOUNTER — Ambulatory Visit (INDEPENDENT_AMBULATORY_CARE_PROVIDER_SITE_OTHER): Payer: Managed Care, Other (non HMO) | Admitting: Sports Medicine

## 2014-06-14 ENCOUNTER — Encounter: Payer: Self-pay | Admitting: Sports Medicine

## 2014-06-14 VITALS — BP 126/87 | HR 90 | Ht 73.0 in | Wt 317.0 lb

## 2014-06-14 DIAGNOSIS — M25562 Pain in left knee: Secondary | ICD-10-CM | POA: Insufficient documentation

## 2014-06-14 DIAGNOSIS — S86112A Strain of other muscle(s) and tendon(s) of posterior muscle group at lower leg level, left leg, initial encounter: Secondary | ICD-10-CM

## 2014-06-14 DIAGNOSIS — S86819A Strain of other muscle(s) and tendon(s) at lower leg level, unspecified leg, initial encounter: Secondary | ICD-10-CM

## 2014-06-14 DIAGNOSIS — IMO0001 Reserved for inherently not codable concepts without codable children: Secondary | ICD-10-CM

## 2014-06-14 DIAGNOSIS — S838X9A Sprain of other specified parts of unspecified knee, initial encounter: Secondary | ICD-10-CM

## 2014-06-14 NOTE — Progress Notes (Signed)
   Subjective:    I'm seeing this patient as a consultation for:  Dr. Ileene Rubens  CC: Left knee pain  HPI: This is a very pleasant 53 year old male, for the past several months he's had pain he localizes in the posterior aspect of his left knee, somewhat medially. Pain is moderate, persistent, worse with weightbearing. No mechanical symptoms, he does not remember any trauma. And he was given NSAIDs which have been ineffective, and was suspected to have a Baker's cyst, and referred to me for consideration of drainage.  Past medical history, Surgical history, Family history not pertinant except as noted below, Social history, Allergies, and medications have been entered into the medical record, reviewed, and no changes needed.   Review of Systems: No headache, visual changes, nausea, vomiting, diarrhea, constipation, dizziness, abdominal pain, skin rash, fevers, chills, night sweats, weight loss, swollen lymph nodes, body aches, joint swelling, muscle aches, chest pain, shortness of breath, mood changes, visual or auditory hallucinations.   Objective:   General: Well Developed, well nourished, and in no acute distress.  Neuro/Psych: Alert and oriented x3, extra-ocular muscles intact, able to move all 4 extremities, sensation grossly intact. Skin: Warm and dry, no rashes noted.  Respiratory: Not using accessory muscles, speaking in full sentences, trachea midline.  Cardiovascular: Pulses palpable, no extremity edema. Abdomen: Does not appear distended. Left Knee: Normal to inspection with no erythema or effusion or obvious bony abnormalities. There is tenderness to palpation posteriorly and medially along the joint line, with some fullness thought to possibly represent a Baker's cyst, there was also a cordlike structure palpable along the medial head of the gastrocnemius, with exquisite pain on palpation. ROM full in flexion and extension and lower leg rotation. Ligaments with solid consistent  endpoints including ACL, PCL, LCL, MCL. Negative Mcmurray's, Apley's, and Thessalonian tests. Non painful patellar compression. Patellar glide without crepitus. Patellar and quadriceps tendons unremarkable. Hamstring and quadriceps strength is normal.   Procedure: Real-time Ultrasound Guided Injection of left medial head of the gastrocnemius trigger point Device: GE Logiq E  Verbal informed consent obtained.  Time-out conducted.  Noted no overlying erythema, induration, or other signs of local infection.  Skin prepped in a sterile fashion.  Local anesthesia: Topical Ethyl chloride.  With sterile technique and under real time ultrasound guidance:  There was no visible Baker's cyst, there was loss of the normal pennate structure of the medial head of the gastrocnemius suggestive of chronic muscle strain. A total of 1 cc Kenalog 40, 4 cc lidocaine injected easily in a fanlike pattern into the abnormal gastrocnemius musculature. Completed without difficulty  Pain immediately resolved suggesting accurate placement of the medication.  Advised to call if fevers/chills, erythema, induration, drainage, or persistent bleeding.  Images permanently stored and available for review in the ultrasound unit.  Impression: Technically successful ultrasound guided injection.  I then applied a strapping to the knee and calf.  Impression and Recommendations:   This case required medical decision making of moderate complexity.

## 2014-06-14 NOTE — Assessment & Plan Note (Signed)
Chronic gastrocnemius strain with pain over the medial head of the gastrocnemius more proximal. Ultrasound guided injection resolved all pain. There was no visible Baker's cyst. Strapped with compressive dressing, formal physical therapy. Return to see me in a month.

## 2014-06-19 ENCOUNTER — Encounter: Payer: Self-pay | Admitting: Family Medicine

## 2014-06-28 ENCOUNTER — Ambulatory Visit (INDEPENDENT_AMBULATORY_CARE_PROVIDER_SITE_OTHER): Payer: Managed Care, Other (non HMO) | Admitting: Physical Therapy

## 2014-06-28 DIAGNOSIS — S86819A Strain of other muscle(s) and tendon(s) at lower leg level, unspecified leg, initial encounter: Secondary | ICD-10-CM

## 2014-06-28 DIAGNOSIS — M25569 Pain in unspecified knee: Secondary | ICD-10-CM

## 2014-06-28 DIAGNOSIS — M25669 Stiffness of unspecified knee, not elsewhere classified: Secondary | ICD-10-CM

## 2014-06-28 DIAGNOSIS — M6281 Muscle weakness (generalized): Secondary | ICD-10-CM

## 2014-06-28 DIAGNOSIS — S838X9A Sprain of other specified parts of unspecified knee, initial encounter: Secondary | ICD-10-CM

## 2014-06-28 DIAGNOSIS — R609 Edema, unspecified: Secondary | ICD-10-CM

## 2014-07-03 ENCOUNTER — Encounter (INDEPENDENT_AMBULATORY_CARE_PROVIDER_SITE_OTHER): Payer: Managed Care, Other (non HMO) | Admitting: Physical Therapy

## 2014-07-03 DIAGNOSIS — M6281 Muscle weakness (generalized): Secondary | ICD-10-CM

## 2014-07-03 DIAGNOSIS — M25569 Pain in unspecified knee: Secondary | ICD-10-CM

## 2014-07-03 DIAGNOSIS — S86819A Strain of other muscle(s) and tendon(s) at lower leg level, unspecified leg, initial encounter: Secondary | ICD-10-CM

## 2014-07-03 DIAGNOSIS — R609 Edema, unspecified: Secondary | ICD-10-CM

## 2014-07-03 DIAGNOSIS — S838X9A Sprain of other specified parts of unspecified knee, initial encounter: Secondary | ICD-10-CM

## 2014-07-03 DIAGNOSIS — M25669 Stiffness of unspecified knee, not elsewhere classified: Secondary | ICD-10-CM

## 2014-07-05 ENCOUNTER — Encounter (INDEPENDENT_AMBULATORY_CARE_PROVIDER_SITE_OTHER): Payer: Managed Care, Other (non HMO) | Admitting: Physical Therapy

## 2014-07-05 DIAGNOSIS — M25569 Pain in unspecified knee: Secondary | ICD-10-CM

## 2014-07-05 DIAGNOSIS — M6281 Muscle weakness (generalized): Secondary | ICD-10-CM

## 2014-07-05 DIAGNOSIS — S86819A Strain of other muscle(s) and tendon(s) at lower leg level, unspecified leg, initial encounter: Secondary | ICD-10-CM

## 2014-07-05 DIAGNOSIS — R609 Edema, unspecified: Secondary | ICD-10-CM

## 2014-07-05 DIAGNOSIS — M25669 Stiffness of unspecified knee, not elsewhere classified: Secondary | ICD-10-CM

## 2014-07-05 DIAGNOSIS — S838X9A Sprain of other specified parts of unspecified knee, initial encounter: Secondary | ICD-10-CM

## 2014-07-10 ENCOUNTER — Encounter (INDEPENDENT_AMBULATORY_CARE_PROVIDER_SITE_OTHER): Payer: Managed Care, Other (non HMO) | Admitting: Physical Therapy

## 2014-07-10 DIAGNOSIS — M25569 Pain in unspecified knee: Secondary | ICD-10-CM

## 2014-07-10 DIAGNOSIS — S838X9A Sprain of other specified parts of unspecified knee, initial encounter: Secondary | ICD-10-CM

## 2014-07-10 DIAGNOSIS — R609 Edema, unspecified: Secondary | ICD-10-CM

## 2014-07-10 DIAGNOSIS — S86819A Strain of other muscle(s) and tendon(s) at lower leg level, unspecified leg, initial encounter: Secondary | ICD-10-CM

## 2014-07-10 DIAGNOSIS — M25669 Stiffness of unspecified knee, not elsewhere classified: Secondary | ICD-10-CM

## 2014-07-10 DIAGNOSIS — M6281 Muscle weakness (generalized): Secondary | ICD-10-CM

## 2014-08-17 ENCOUNTER — Encounter: Payer: Self-pay | Admitting: Sports Medicine

## 2014-08-17 ENCOUNTER — Ambulatory Visit (INDEPENDENT_AMBULATORY_CARE_PROVIDER_SITE_OTHER): Payer: Managed Care, Other (non HMO) | Admitting: Sports Medicine

## 2014-08-17 VITALS — BP 130/89 | HR 103 | Ht 73.0 in | Wt 316.0 lb

## 2014-08-17 DIAGNOSIS — S86112D Strain of other muscle(s) and tendon(s) of posterior muscle group at lower leg level, left leg, subsequent encounter: Secondary | ICD-10-CM

## 2014-08-17 DIAGNOSIS — S838X9A Sprain of other specified parts of unspecified knee, initial encounter: Secondary | ICD-10-CM

## 2014-08-17 DIAGNOSIS — S86819A Strain of other muscle(s) and tendon(s) at lower leg level, unspecified leg, initial encounter: Secondary | ICD-10-CM

## 2014-08-17 NOTE — Patient Instructions (Signed)
Go to imaging at 8:45 on Monday for an official ultrasound of the leg

## 2014-08-17 NOTE — Progress Notes (Signed)
Patient ID: Walter Douglas, male   DOB: 18-May-1961, 53 y.o.   MRN: [id number]  Subjective:    CC: Left posterior knee/calf pain  HPI: Walter Douglas is a very pleasant 53 year old male with a history of left chronic gastrocnemius strain who presents with one month of left proximal calf pain. Reports that he was completely improved after ultrasound guided injection two months ago (7/1) and subsequent formal PT. Compressive dressing and kinesio tape done in PT provided the most significant relief. Feels that this pain has returned slowly over the past month and was significantly exacerbated two weeks ago after exerting himself while helping his daughter move. Pain is exacerbated with walking or going up and down stairs.   Past medical history, Surgical history, Family history not pertinant except as noted below, Social history, Allergies, and medications have been entered into the medical record, reviewed, and no changes needed.   Review of Systems: No fevers, chills, night sweats, weight loss, chest pain, or shortness of breath.   Objective:    General: Well developed, well nourished, and in no acute distress.  Neuro: Alert and oriented x3, extra-ocular muscles intact, sensation grossly intact.  HEENT: Normocephalic, atraumatic, pupils equal round reactive to light, neck supple, no masses, no lymphadenopathy, thyroid nonpalpable.  Skin: Warm and dry, no rashes. Cardiac: Regular rate and rhythm, no murmurs rubs or gallops, no lower extremity edema.  Respiratory: Clear to auscultation bilaterally. Not using accessory muscles, speaking in full sentences. Left Knee: Normal to inspection with no erythema or effusion or obvious bony abnormalities. Palpation normal with no warmth, joint line tenderness, patellar tenderness, or condyle tenderness. ROM full in flexion and extension and lower leg rotation. Ligaments with solid consistent endpoints including ACL, PCL, LCL, MCL. Negative Mcmurray's, Apley's,  and Thessalonian tests. Non painful patellar compression. Patellar glide without crepitus. Patellar and quadriceps tendons unremarkable. Hamstring and quadriceps strength is normal.  Tenderness to palpation at the medial head of the gastrocnemius.  Procedure: Real-time Ultrasound Guided Injection of left medial head of the gastrocnemius Device: GE Logiq E  Verbal informed consent obtained.  Time-out conducted.  Noted no overlying erythema, induration, or other signs of local infection.  Skin prepped in a sterile fashion.  Local anesthesia: Topical Ethyl chloride.  With sterile technique and under real time ultrasound guidance:  Noted hypoechoic change and loss of the normal pennate fibers of the gastrocnemius medial head, total 1 cc kenalog 40, 4 cc lidocaine injected in a fanlike pattern Completed without difficulty  Pain immediately resolved suggesting accurate placement of the medication.  Advised to call if fevers/chills, erythema, induration, drainage, or persistent bleeding.  Images permanently stored and available for review in the ultrasound unit.  Impression: Technically successful ultrasound guided injection.  Impression and Recommendations:   Left Gastrocnemius Strain: Exacerbation of previous gastrocnemius strain is likely in this patient with worsening pain in the region of a previously-diagnosed medial head tear. Still, with calf pain that is localizable over blood vessels during ultrasound guided injection, we will do a formal ultrasound to evaluate for DVT. - Repeat injection today - Referral to formal PT for kinesiotaping - Follow-up in one month

## 2014-08-17 NOTE — Assessment & Plan Note (Signed)
Was doing extremely well after injection into muscle strain. Reinjured while helping his daughter move into his house. Repeat injection. Formal ultrasound to evaluate for DVT. Physical therapy for kinesiotaping. Return in one month.

## 2014-08-21 ENCOUNTER — Other Ambulatory Visit: Payer: Self-pay | Admitting: Sports Medicine

## 2014-08-21 ENCOUNTER — Other Ambulatory Visit: Payer: Managed Care, Other (non HMO)

## 2014-08-21 ENCOUNTER — Ambulatory Visit (HOSPITAL_BASED_OUTPATIENT_CLINIC_OR_DEPARTMENT_OTHER)
Admission: RE | Admit: 2014-08-21 | Discharge: 2014-08-21 | Disposition: A | Discharge status: Home / Self Care | Payer: Managed Care, Other (non HMO) | Source: Ambulatory Visit | Attending: Sports Medicine | Admitting: Sports Medicine

## 2014-08-21 DIAGNOSIS — M7989 Other specified soft tissue disorders: Secondary | ICD-10-CM | POA: Diagnosis not present

## 2014-08-21 DIAGNOSIS — S86112D Strain of other muscle(s) and tendon(s) of posterior muscle group at lower leg level, left leg, subsequent encounter: Secondary | ICD-10-CM

## 2014-08-21 DIAGNOSIS — M79609 Pain in unspecified limb: Secondary | ICD-10-CM | POA: Insufficient documentation

## 2014-08-22 ENCOUNTER — Other Ambulatory Visit (HOSPITAL_BASED_OUTPATIENT_CLINIC_OR_DEPARTMENT_OTHER): Payer: Managed Care, Other (non HMO)

## 2014-08-28 ENCOUNTER — Ambulatory Visit (INDEPENDENT_AMBULATORY_CARE_PROVIDER_SITE_OTHER): Payer: Managed Care, Other (non HMO) | Admitting: Physical Therapy

## 2014-08-28 DIAGNOSIS — M25673 Stiffness of unspecified ankle, not elsewhere classified: Secondary | ICD-10-CM

## 2014-08-28 DIAGNOSIS — Z5189 Encounter for other specified aftercare: Secondary | ICD-10-CM

## 2014-08-28 DIAGNOSIS — M25569 Pain in unspecified knee: Secondary | ICD-10-CM

## 2014-08-28 DIAGNOSIS — S838X9A Sprain of other specified parts of unspecified knee, initial encounter: Secondary | ICD-10-CM

## 2014-08-28 DIAGNOSIS — S86819A Strain of other muscle(s) and tendon(s) at lower leg level, unspecified leg, initial encounter: Secondary | ICD-10-CM

## 2014-08-28 DIAGNOSIS — M25676 Stiffness of unspecified foot, not elsewhere classified: Secondary | ICD-10-CM

## 2014-10-30 ENCOUNTER — Encounter: Payer: Self-pay | Admitting: Family Medicine

## 2014-10-31 ENCOUNTER — Telehealth: Payer: Self-pay | Admitting: Family Medicine

## 2014-10-31 DIAGNOSIS — M5137 Other intervertebral disc degeneration, lumbosacral region: Secondary | ICD-10-CM

## 2014-10-31 NOTE — Telephone Encounter (Signed)
Referral

## 2014-11-20 IMAGING — CR DG KNEE COMPLETE 4+V*L*
4 series · 4 of 4 positions shown · non-contrast
Comparison: None.

CLINICAL DATA: left knee pain with bakers cyst eval for DJD a

EXAM:
LEFT KNEE - COMPLETE 4+ VIEW

[view not recorded (1 of 4)]
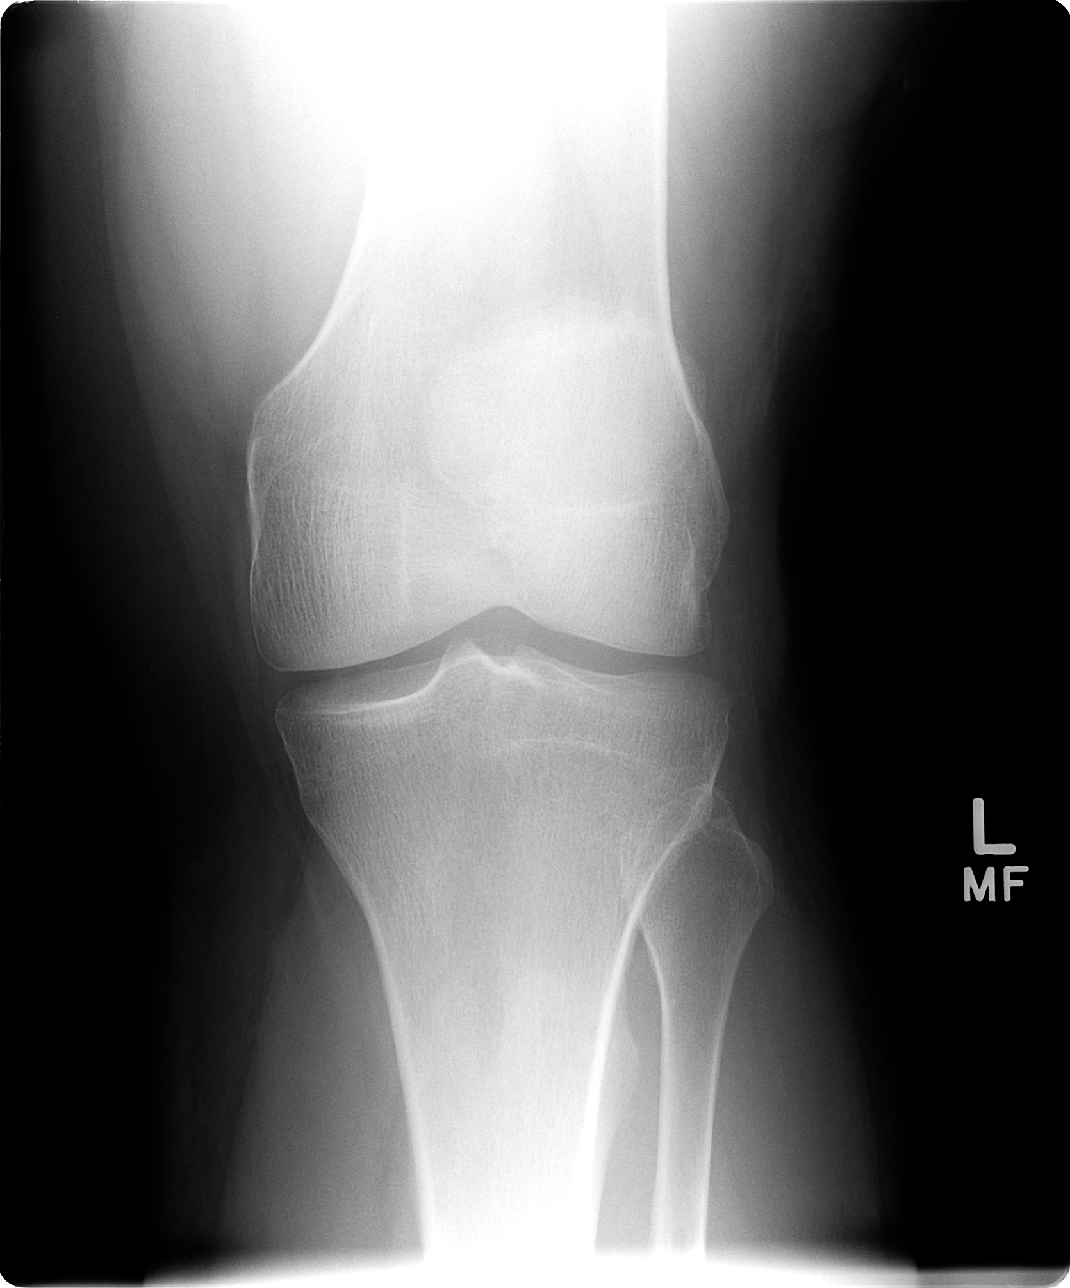

[view not recorded (2 of 4)]
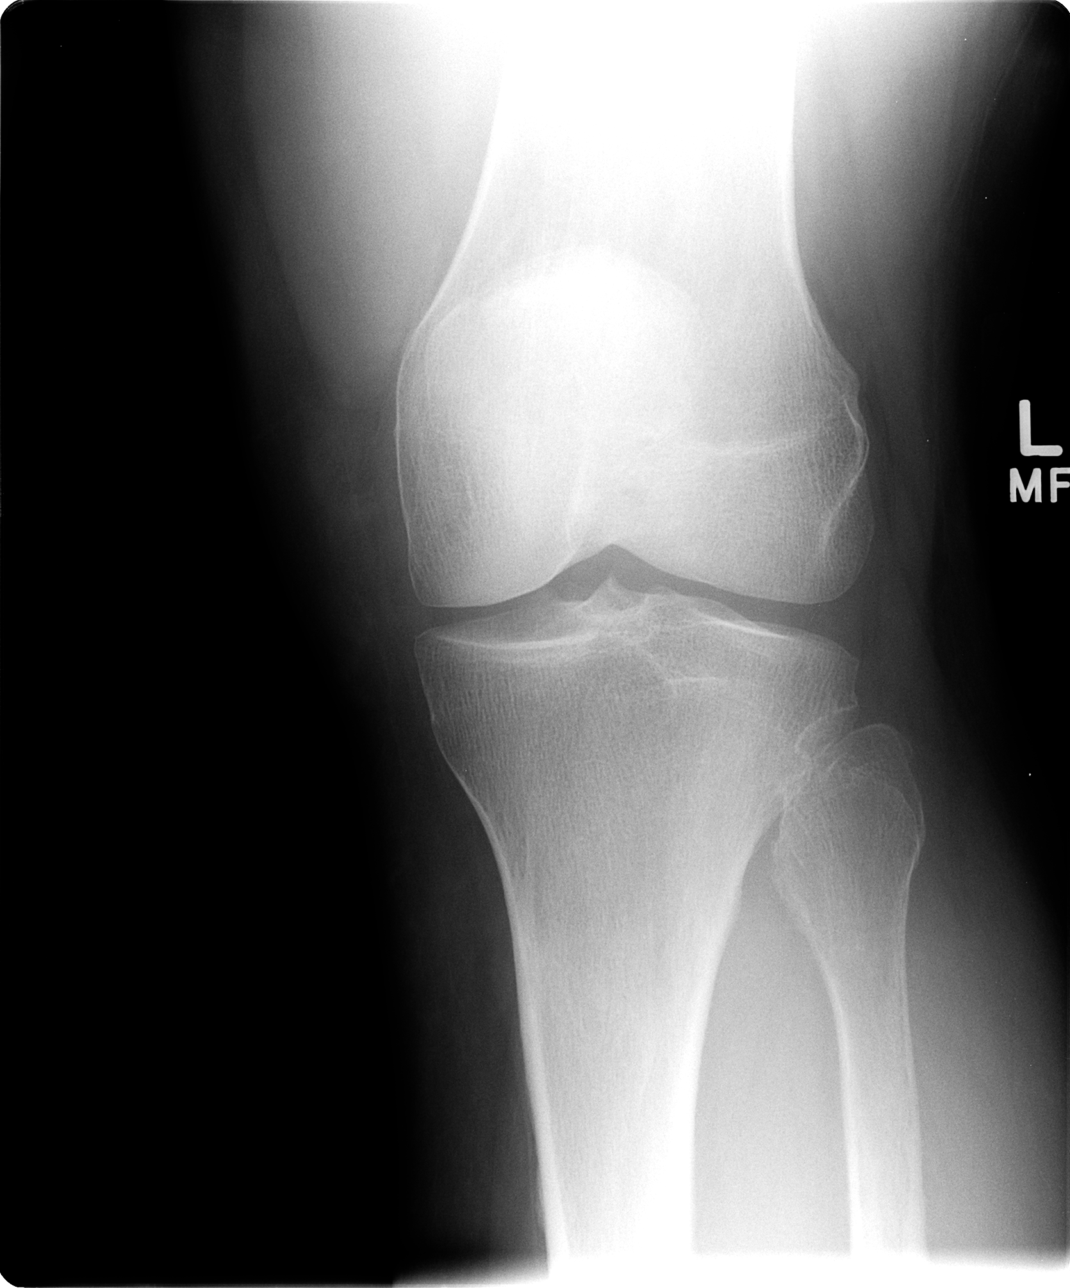

[view not recorded (3 of 4)]
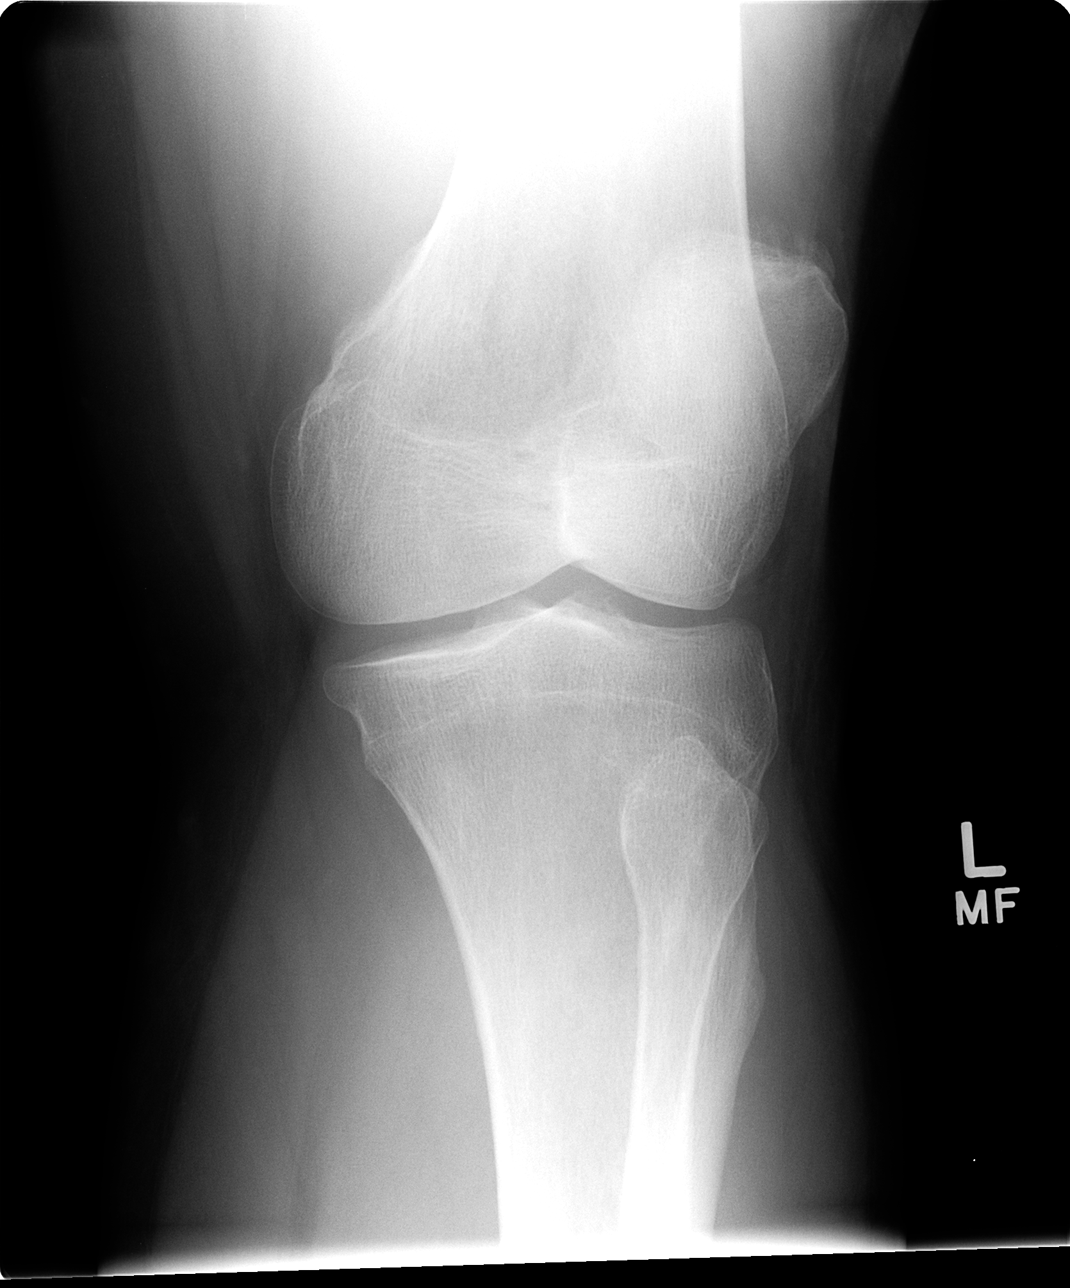

[view not recorded (4 of 4)]
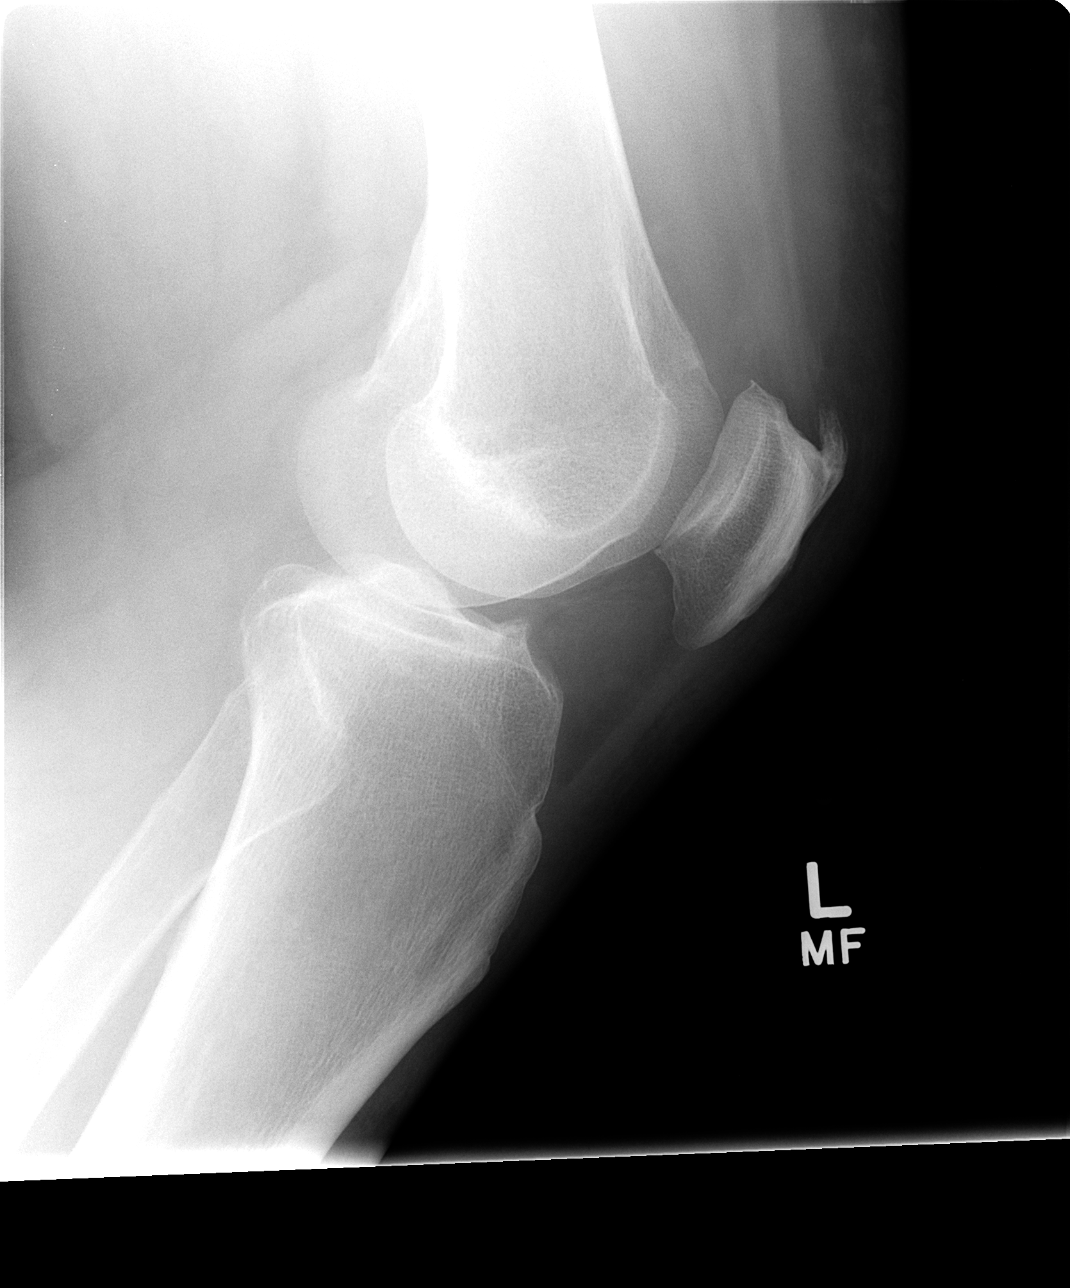

[4 of 4 positions shown; findings below may reference images not displayed]

FINDINGS: There is no evidence of fracture, dislocation, or joint effusion.
Patellar traction spur at the quadriceps tendon insertion. There is
no other evidence of arthropathy or other focal bone abnormality.
Soft tissues are unremarkable.
IMPRESSION: Negative.

## 2014-12-01 ENCOUNTER — Emergency Department (INDEPENDENT_AMBULATORY_CARE_PROVIDER_SITE_OTHER)
Admission: EM | Admit: 2014-12-01 | Discharge: 2014-12-01 | Disposition: A | Discharge status: Home / Self Care | Payer: Managed Care, Other (non HMO) | Source: Home / Self Care | Attending: Emergency Medicine | Admitting: Emergency Medicine

## 2014-12-01 ENCOUNTER — Encounter: Payer: Self-pay | Admitting: *Deleted

## 2014-12-01 DIAGNOSIS — S0501XA Injury of conjunctiva and corneal abrasion without foreign body, right eye, initial encounter: Secondary | ICD-10-CM

## 2014-12-01 MED ORDER — HYDROCODONE-ACETAMINOPHEN 5-325 MG PO TABS: 1.0000 | ORAL_TABLET | ORAL | Status: DC | PRN

## 2014-12-01 MED ORDER — GENTAMICIN SULFATE 0.3 % OP SOLN: 2.0000 [drp] | OPHTHALMIC | Status: AC

## 2014-12-01 NOTE — ED Notes (Signed)
Walter Douglas reports getting a foreign object in right eye while mowing today. Unsuccessful with rinsing.

## 2014-12-01 NOTE — Discharge Instructions (Signed)
Corneal Abrasion The cornea is the clear covering at the front and center of the eye. When looking at the colored portion of the eye (iris), you are looking through the cornea. This very thin tissue is made up of many layers. The surface layer is a single layer of cells (corneal epithelium) and is one of the most sensitive tissues in the body. If a scratch or injury causes the corneal epithelium to come off, it is called a corneal abrasion. If the injury extends to the tissues below the epithelium, the condition is called a corneal ulcer. CAUSES   Scratches.  Trauma.  Foreign body in the eye. Some people have recurrences of abrasions in the area of the original injury even after it has healed (recurrent erosion syndrome). Recurrent erosion syndrome generally improves and goes away with time. SYMPTOMS   Eye pain.  Difficulty or inability to keep the injured eye open.  The eye becomes very sensitive to light.  Recurrent erosions tend to happen suddenly, first thing in the morning, usually after waking up and opening the eye. DIAGNOSIS  Your health care provider can diagnose a corneal abrasion during an eye exam. Dye is usually placed in the eye using a drop or a small paper strip moistened by your tears. When the eye is examined with a special light, the abrasion shows up clearly because of the dye. TREATMENT   Small abrasions may be treated with antibiotic drops or ointment alone.  A pressure patch may be put over the eye. If this is done, follow your doctor's instructions for when to remove the patch.--Remove the patch tomorrow midday, then start using antibiotic eyedrops as prescribed.--   Do not drive or use machines while the eye patch is on. Judging distances is hard to do with a patch on. If the abrasion becomes infected and spreads to the deeper tissues of the cornea, a corneal ulcer can result. This is serious because it can cause corneal scarring. Corneal scars interfere with  light passing through the cornea and cause a loss of vision in the involved eye. HOME CARE INSTRUCTIONS--written prescription for Vicodin if needed for severe pain. Otherwise, use OTC Tylenol or ibuprofen  Use medicine or ointment as directed. Only take over-the-counter or prescription medicines for pain, discomfort, or fever as directed by your health care provider.  Do not drive or operate machinery if your eye is patched. Your ability to judge distances is impaired.  If your health care provider has given you a follow-up appointment, it is very important to keep that appointment. Not keeping the appointment could result in a severe eye infection or permanent loss of vision. If there is any problem keeping the appointment, let your health care provider know. SEEK MEDICAL CARE IF:   You have pain, light sensitivity, and a scratchy feeling in one eye or both eyes.  Your pressure patch keeps loosening up, and you can blink your eye under the patch after treatment.  Any kind of discharge develops from the eye after treatment or if the lids stick together in the morning.  You have the same symptoms in the morning as you did with the original abrasion days, weeks, or months after the abrasion healed. MAKE SURE YOU:   Understand these instructions.  Will watch your condition.  Will get help right away if you are not doing well or get worse. Document Released: 11/28/2000 Document Revised: 12/06/2013 Document Reviewed: 08/08/2013 [facility] Patient Information 2015 [place], [place]. This information is not intended  to replace advice given to you by your health care provider. Make sure you discuss any questions you have with your health care provider. ° °

## 2014-12-01 NOTE — ED Provider Notes (Signed)
CSN: [id number]     Arrival date & time 12/01/14  1834 History   First MD Initiated Contact with Patient 12/01/14 1905     Chief Complaint  Patient presents with  . Foreign Body in [place]   (Consider location/radiation/quality/duration/timing/severity/associated sxs/prior Treatment) HPI While mowing his yard today, felt an acute foreign body sensation in right eye. Right eye has been very painful, when blinking he feels a foreign body sensation right eye.  Past Medical History  Diagnosis Date  . Rheumatoid arthritis 10/05/2013    Pt self reported   . BPH (benign prostatic hyperplasia)   . Fatty liver disease, nonalcoholic    Past Surgical History  Procedure Laterality Date  . Rotator cuff surgery  2007  . Nose surgery  2007  . Back surgery    . Colonoscopy    . Vericose veins     Family History  Problem Relation Age of Onset  . Cancer Father     bone  . Cancer Sister     breast  . Cancer Sister     ovarian   History  Substance Use Topics  . Smoking status: Former Smoker    Quit date: 01/05/1994  . Smokeless tobacco: Never Used     Comment: quit 20 years ago  . Alcohol Use: No    Review of Systems  All other systems reviewed and are negative.   Allergies  Ciprofloxacin  Home Medications   Prior to Admission medications   Medication Sig Start Date End Date Taking? Authorizing Provider  escitalopram (LEXAPRO) 10 MG tablet Take 1 tablet (10 mg total) by mouth daily. 06/09/14  Yes Sean Hommel, DO  gentamicin (GARAMYCIN) 0.3 % ophthalmic solution Place 2 drops into the right eye every 4 (four) hours. X 7 days.  (Start on Saturday after removing eye patch) 12/01/14   Jacqulyn Cane, MD  glucosamine-chondroitin 500-400 MG tablet Take 1 tablet by mouth 3 (three) times daily.    Historical Provider, MD  HYDROcodone-acetaminophen (NORCO/VICODIN) 5-325 MG per tablet Take 1-2 tablets by mouth every 4 (four) hours as needed for severe pain. Take with food. 12/01/14   Jacqulyn Cane, MD  hyoscyamine (LEVSIN/SL) 0.125 MG SL tablet 1-2 tablets by mouth every 4 hours as needed 04/03/14   Ladene Artist, MD  meloxicam (MOBIC) 15 MG tablet Take 1 tablet (15 mg total) by mouth daily. As needed for knee pain. 06/09/14   Marcial Pacas, DO  misoprostol (CYTOTEC) 200 MCG tablet Take 1 tablet (200 mcg total) by mouth 2 (two) times daily. As needed with diclofenac dosing. 05/23/14   Marcial Pacas, DO  Multiple Vitamin (MULTIVITAMIN) tablet Take 1 tablet by mouth daily.    Historical Provider, MD  omega-3 acid ethyl esters (LOVAZA) 1 G capsule Take by mouth 2 (two) times daily.    Historical Provider, MD   BP 124/85 mmHg  Pulse 104  Temp(Src) 97.5 F (36.4 C) (Oral)  Resp 14  Ht 6\' 2"  (1.88 m)  Wt 320 lb (145.151 kg)  BMI 41.07 kg/m2  SpO2 96% Physical Exam  Constitutional: He is oriented to person, place, and time. He appears well-developed and well-nourished. He appears distressed (from right eye pain).  HENT:  Head: Normocephalic and atraumatic.  Eyes: EOM are normal. Pupils are equal, round, and reactive to light. Lids are everted and swept, no foreign bodies found. Right eye exhibits no discharge. No foreign body present in the right eye. Left eye exhibits no discharge. No foreign body present  in the left eye. Right conjunctiva is injected. Right conjunctiva has no hemorrhage. Left conjunctiva is not injected. Left conjunctiva has no hemorrhage. No scleral icterus.  Slit lamp exam:      The right eye shows corneal abrasion and fluorescein uptake. The right eye shows no corneal ulcer, no foreign body and no anterior chamber bulge.    Neck: Normal range of motion.  Cardiovascular: Normal rate.   Pulmonary/Chest: Effort normal.  Abdominal: He exhibits no distension.  Musculoskeletal: Normal range of motion.  Neurological: He is alert and oriented to person, place, and time.  Skin: Skin is warm.  Psychiatric: He has a normal mood and affect.  Nursing note and vitals  reviewed.   ED Course  Procedures (including critical care time) Labs Review Labs Reviewed - No data to display  Imaging Review No results found.   MDM   1. Injury of conjunctiva and corneal abrasion of right eye w/o FB, initial encounter    2 drops of tetracaine instilled in right eye and he had significant pain relief within 2 minutes. Further eye exam as described above. Visual acuity 20/25 left, 20/30 right After fluorescene stain, corneal abrasion as depicted. Saline eye rinse used to rinse out the remaining fluorescene. Gentamicin ointment right eye, then double eye patch. Discharge Medication List as of 12/01/2014  8:00 PM    START taking these medications   Details  gentamicin (GARAMYCIN) 0.3 % ophthalmic solution Place 2 drops into the right eye every 4 (four) hours. X 7 days.  (Start on Saturday after removing eye patch), Starting 12/01/2014, Until Discontinued, Print    HYDROcodone-acetaminophen (NORCO/VICODIN) 5-325 MG per tablet Take 1-2 tablets by mouth every 4 (four) hours as needed for severe pain. Take with food., Starting 12/01/2014, Until Discontinued, Print       leave patch on for 24 hours then remove patch and start gentamicin eyedrops. See detailed Instructions in AVS, which were given to patient. Verbal instructions also given. Risks, benefits, and alternatives of treatment options discussed. Questions invited and answered. Patient voiced understanding and agreement with plans. Over 45 minutes spent, greater than 50% of the time spent for counseling and coordination of care.      Jacqulyn Cane, MD 12/01/14 [phone]

## 2014-12-04 ENCOUNTER — Encounter: Payer: Self-pay | Admitting: Sports Medicine

## 2014-12-04 ENCOUNTER — Ambulatory Visit (INDEPENDENT_AMBULATORY_CARE_PROVIDER_SITE_OTHER): Payer: Managed Care, Other (non HMO)

## 2014-12-04 ENCOUNTER — Ambulatory Visit (INDEPENDENT_AMBULATORY_CARE_PROVIDER_SITE_OTHER): Payer: Managed Care, Other (non HMO) | Admitting: Sports Medicine

## 2014-12-04 DIAGNOSIS — M25762 Osteophyte, left knee: Secondary | ICD-10-CM

## 2014-12-04 DIAGNOSIS — Z1389 Encounter for screening for other disorder: Secondary | ICD-10-CM

## 2014-12-04 DIAGNOSIS — M25562 Pain in left knee: Secondary | ICD-10-CM

## 2014-12-04 DIAGNOSIS — M25462 Effusion, left knee: Secondary | ICD-10-CM

## 2014-12-04 NOTE — Progress Notes (Signed)
  Subjective:    CC: Left knee pain  HPI: This is a pleasant 53 year old male, he did have a medial head of the gastrocnemius strain in the recent past, we did an intra-muscular injection which resolved symptoms for a brief period time, unfortunately having recurrence of pain he localizes at the posterior medial joint line, worse with deep flexion, no mechanical symptoms. Moderate, persistent.  Past medical history, Surgical history, Family history not pertinant except as noted below, Social history, Allergies, and medications have been entered into the medical record, reviewed, and no changes needed.   Review of Systems: No fevers, chills, night sweats, weight loss, chest pain, or shortness of breath.   Objective:    General: Well Developed, well nourished, and in no acute distress.  Neuro: Alert and oriented x3, extra-ocular muscles intact, sensation grossly intact.  HEENT: Normocephalic, atraumatic, pupils equal round reactive to light, neck supple, no masses, no lymphadenopathy, thyroid nonpalpable.  Skin: Warm and dry, no rashes. Cardiac: Regular rate and rhythm, no murmurs rubs or gallops, no lower extremity edema.  Respiratory: Clear to auscultation bilaterally. Not using accessory muscles, speaking in full sentences. Left Knee: Normal to inspection with no erythema or effusion or obvious bony abnormalities. Tender to palpation at the posterior medial joint line, reproduction of pain with terminal flexion. ROM normal in flexion and extension and lower leg rotation. Ligaments with solid consistent endpoints including ACL, PCL, LCL, MCL. Negative Mcmurray's and provocative meniscal tests. Non painful patellar compression. Patellar and quadriceps tendons unremarkable. Hamstring and quadriceps strength is normal.  Procedure: Real-time Ultrasound Guided Injection of left knee Device: GE Logiq E  Verbal informed consent obtained.  Time-out conducted.  Noted no overlying erythema,  induration, or other signs of local infection.  Skin prepped in a sterile fashion.  Local anesthesia: Topical Ethyl chloride.  With sterile technique and under real time ultrasound guidance: 2 mL kenalog 40, 4 mL lidocaine injected easily into the suprapatellar recess.  Completed without difficulty  Pain immediately resolved suggesting accurate placement of the medication.  Advised to call if fevers/chills, erythema, induration, drainage, or persistent bleeding.  Images permanently stored and available for review in the ultrasound unit.  Impression: Technically successful ultrasound guided injection.  Impression and Recommendations:

## 2014-12-04 NOTE — Assessment & Plan Note (Signed)
Suspect tearing of the posterior horn of the medial meniscus. Injection as above. Formal physical therapy. X-rays.  Return in one month, MRI for arthroscopy of no better.

## 2015-12-25 ENCOUNTER — Encounter: Payer: Self-pay | Admitting: Family Medicine

## 2016-01-22 ENCOUNTER — Encounter: Payer: Self-pay | Admitting: Family Medicine
